# Patient Record
Sex: Female | Born: 1990 | Race: Black or African American | Hispanic: No | Marital: Single | Smoking: Former smoker
Health system: Southern US, Community
[De-identification: ages and names within clinical notes are randomized; demographics above are authoritative.]

## PROBLEM LIST (undated history)

## (undated) DIAGNOSIS — T7840XA Allergy, unspecified, initial encounter: Secondary | ICD-10-CM

## (undated) DIAGNOSIS — J45909 Unspecified asthma, uncomplicated: Secondary | ICD-10-CM

## (undated) HISTORY — DX: Allergy, unspecified, initial encounter: T78.40XA

## (undated) HISTORY — DX: Unspecified asthma, uncomplicated: J45.909

---
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
## 2010-11-23 ENCOUNTER — Emergency Department: Payer: Self-pay | Admitting: Emergency Medicine

## 2011-03-15 ENCOUNTER — Emergency Department: Payer: Self-pay | Admitting: Emergency Medicine

## 2012-01-24 ENCOUNTER — Emergency Department: Payer: Self-pay | Admitting: Emergency Medicine

## 2012-01-24 LAB — COMPREHENSIVE METABOLIC PANEL
Anion Gap: 8 (ref 7–16)
Bilirubin,Total: 0.3 mg/dL (ref 0.2–1.0)
Chloride: 107 mmol/L (ref 98–107)
Co2: 24 mmol/L (ref 21–32)
EGFR (African American): >60
EGFR (Non-African Amer.): >60
Glucose: 79 mg/dL (ref 65–99)
SGOT(AST): 19 U/L (ref 15–37)
Sodium: 139 mmol/L (ref 136–145)
Total Protein: 7.4 g/dL (ref 6.4–8.2)

## 2012-01-24 LAB — CBC
HCT: 34.9 % — ABNORMAL LOW (ref 35.0–47.0)
MCH: 28 pg (ref 26.0–34.0)
MCV: 88 fL (ref 80–100)
Platelet: 279 10*3/uL (ref 150–440)
RBC: 3.96 10*6/uL (ref 3.80–5.20)
RDW: 14.1 % (ref 11.5–14.5)

## 2012-01-29 LAB — CULTURE, BLOOD (SINGLE)

## 2013-10-01 ENCOUNTER — Emergency Department: Payer: Self-pay | Admitting: Emergency Medicine

## 2013-11-10 LAB — HM HIV SCREENING LAB: HM HIV Screening: NEGATIVE

## 2014-09-28 DIAGNOSIS — E663 Overweight: Secondary | ICD-10-CM | POA: Insufficient documentation

## 2014-09-28 DIAGNOSIS — Z8619 Personal history of other infectious and parasitic diseases: Secondary | ICD-10-CM | POA: Insufficient documentation

## 2014-09-28 DIAGNOSIS — F319 Bipolar disorder, unspecified: Secondary | ICD-10-CM | POA: Insufficient documentation

## 2015-11-12 IMAGING — CR DG FOOT COMPLETE 3+V*L*
1 series · 3 of 3 positions shown · non-contrast
Comparison: None.

CLINICAL DATA: Pain and swelling to the left foot after a fall.

EXAM:
LEFT FOOT - COMPLETE 3+ VIEW

[Series 1: x foot ap left · 0.14mm/px · 3 of 3 slices shown]
[im 1/3]
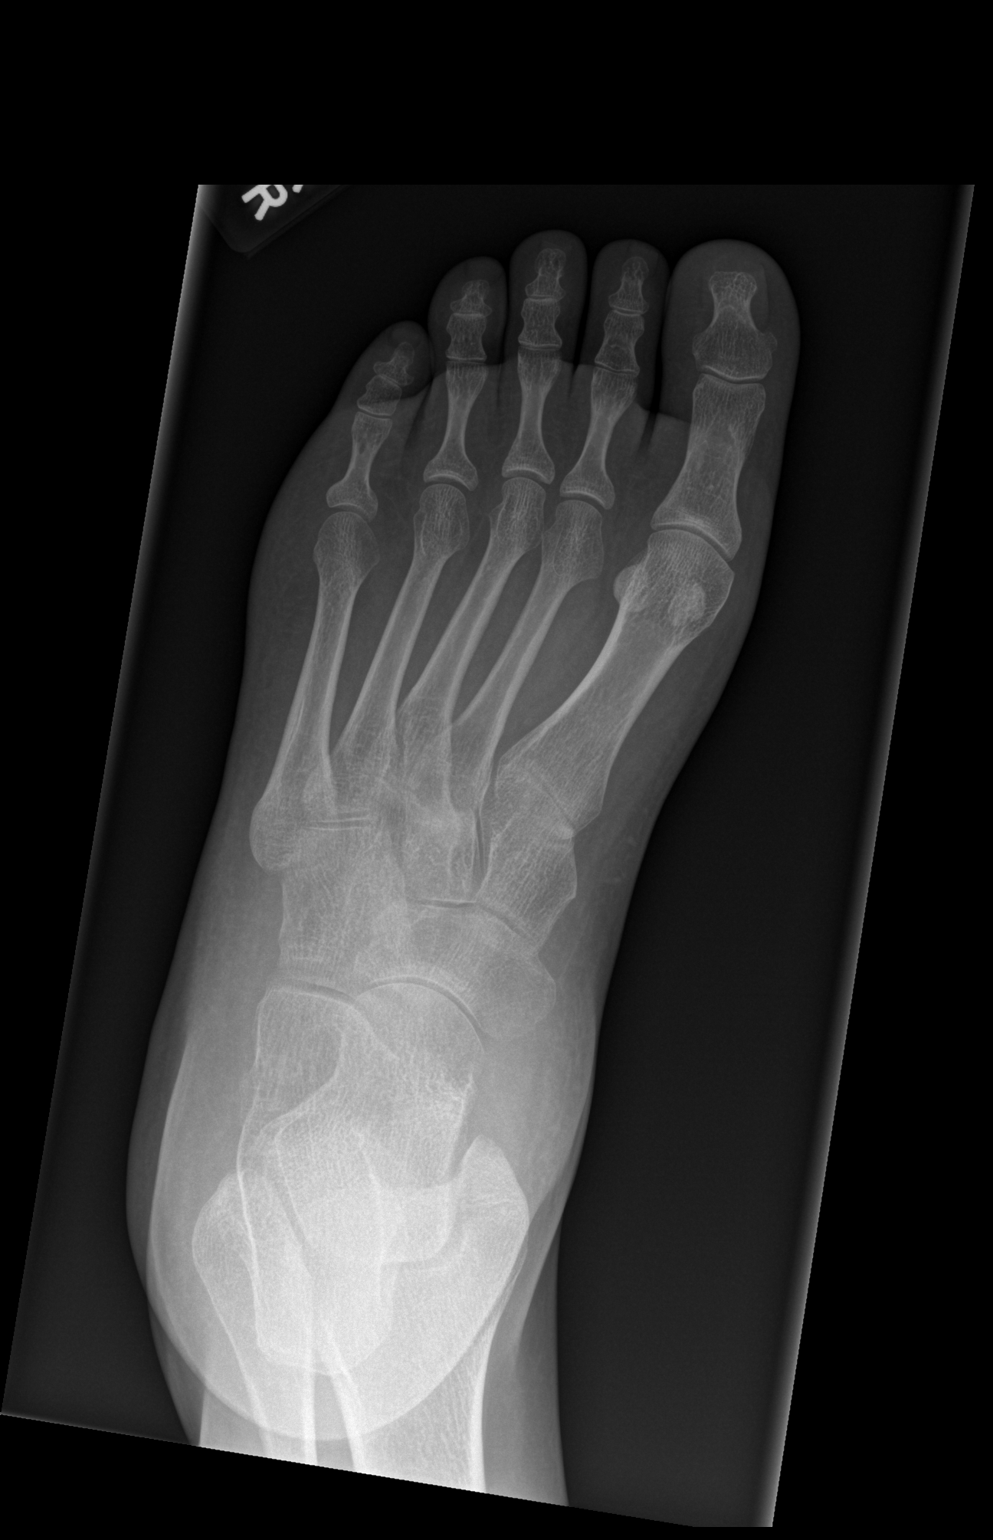
[im 2/3]
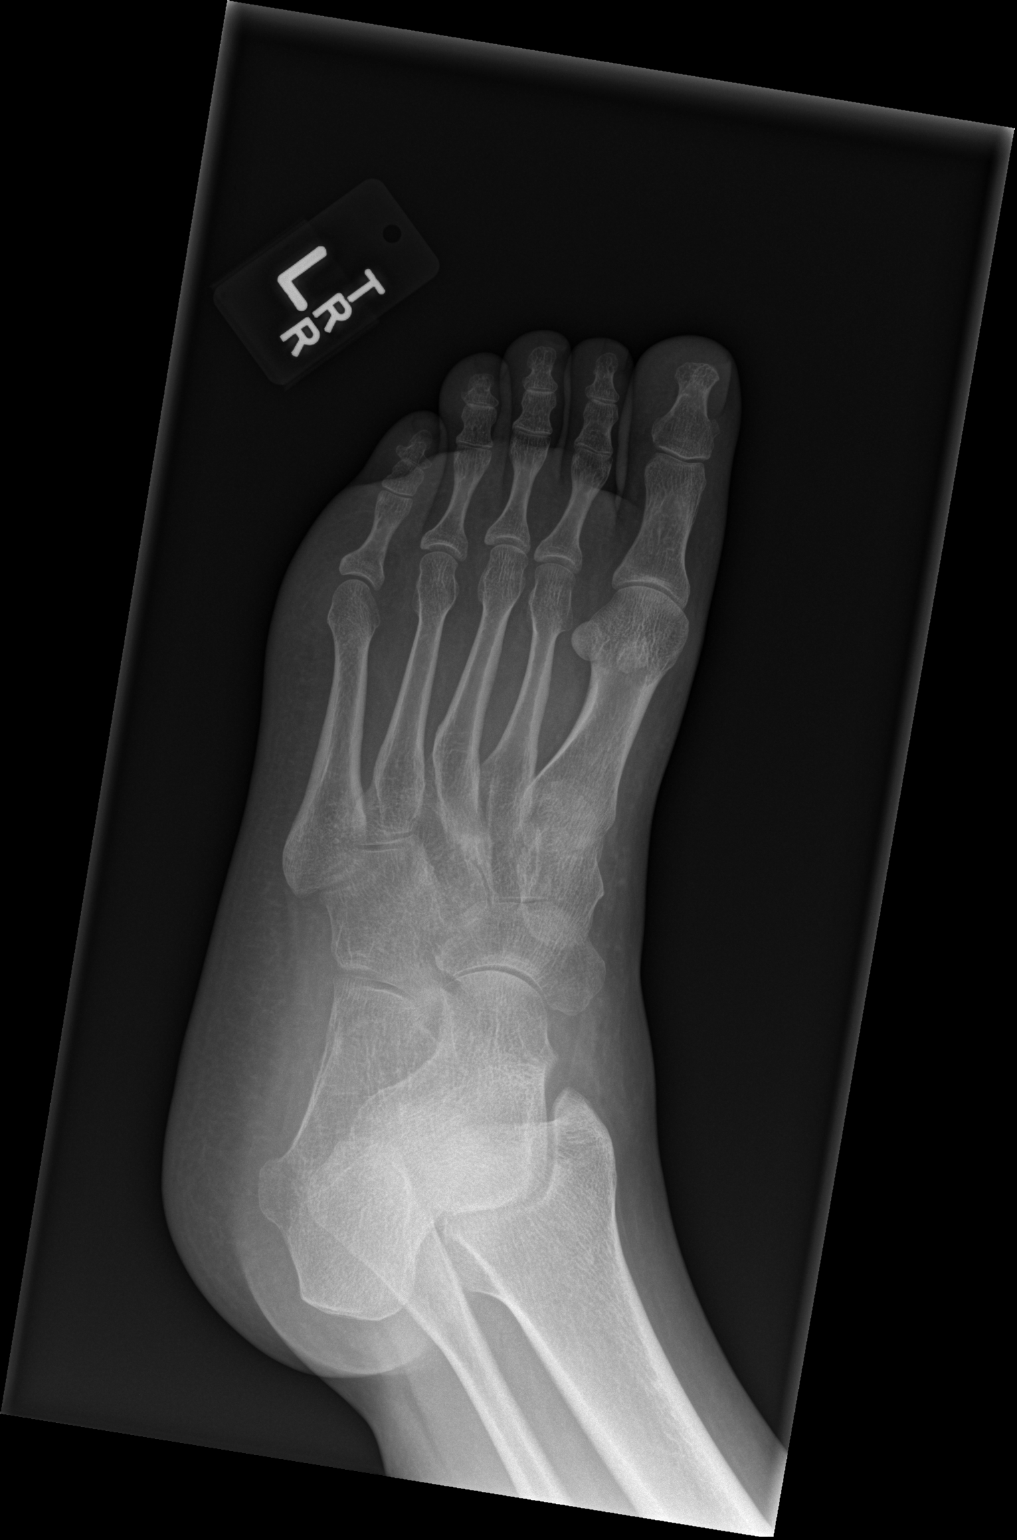
[im 3/3]
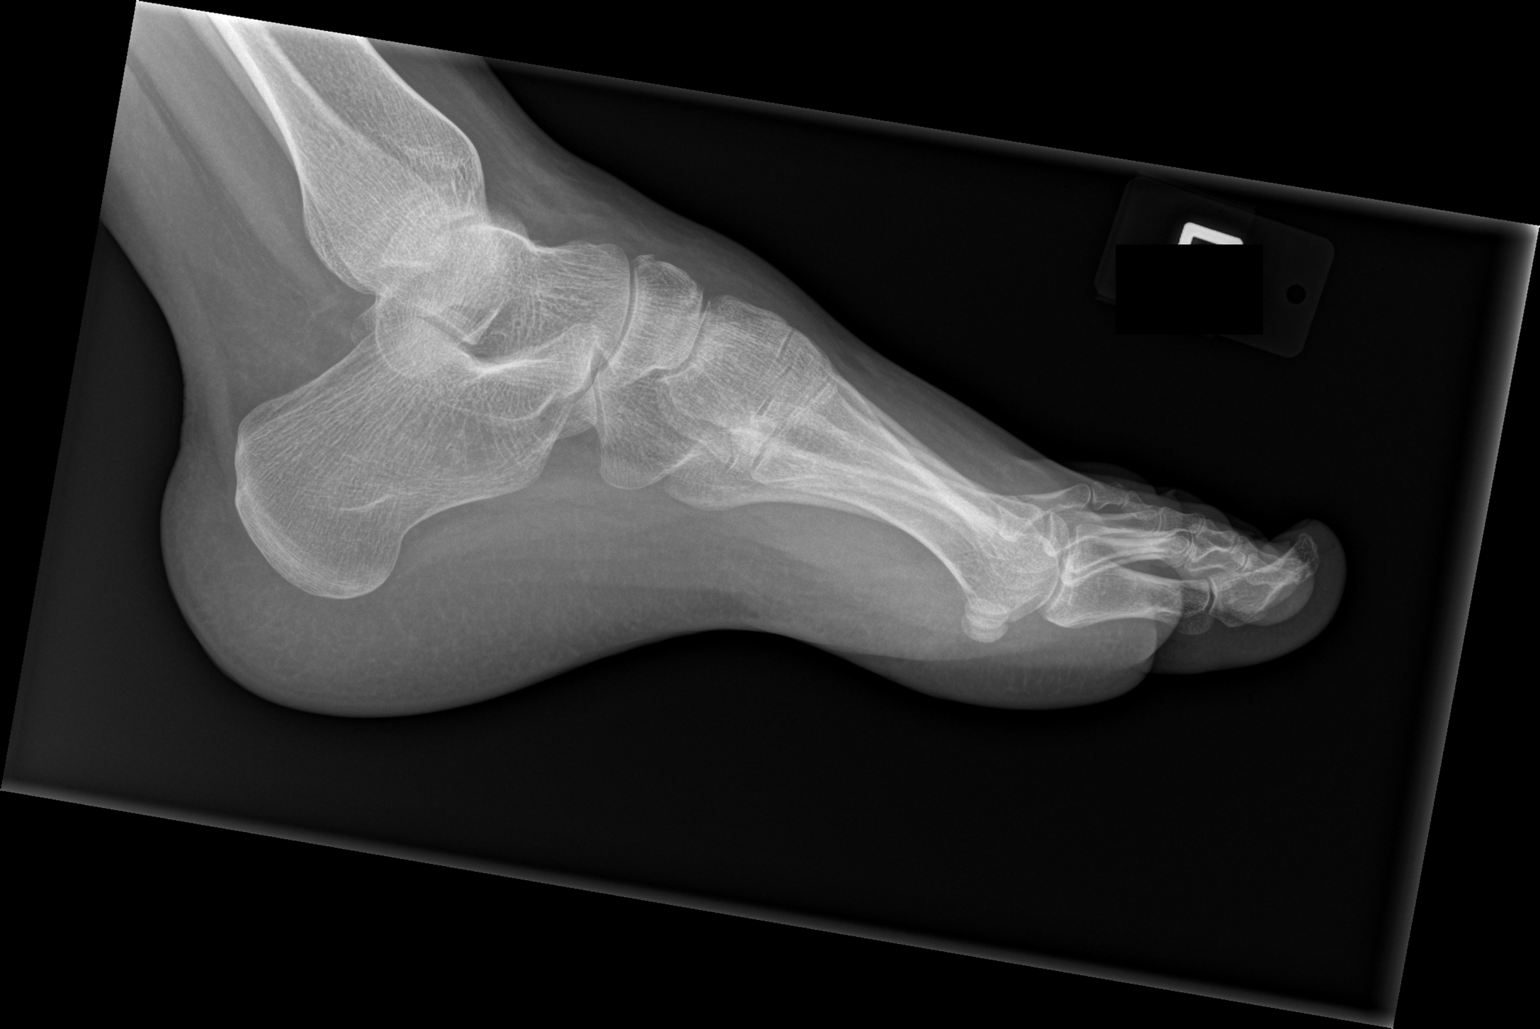

[3 of 3 positions shown; findings below may reference images not displayed]

FINDINGS: Suggestion of small focal cortical defect over the dorsal aspect of
the navicular bone with overlying soft tissue swelling. Suggests a
small avulsion fracture. No displaced fractures identified.
IMPRESSION: Probable small avulsion fracture of the dorsal aspect of the
navicular.

## 2016-02-20 ENCOUNTER — Emergency Department
Admission: EM | Admit: 2016-02-20 | Discharge: 2016-02-20 | Disposition: A | Discharge status: Home / Self Care | Payer: Self-pay | Attending: Student in an Organized Health Care Education/Training Program | Admitting: Student in an Organized Health Care Education/Training Program

## 2016-02-20 ENCOUNTER — Encounter: Payer: Self-pay | Admitting: Emergency Medicine

## 2016-02-20 DIAGNOSIS — L0291 Cutaneous abscess, unspecified: Secondary | ICD-10-CM

## 2016-02-20 DIAGNOSIS — L02211 Cutaneous abscess of abdominal wall: Secondary | ICD-10-CM | POA: Insufficient documentation

## 2016-02-20 MED ORDER — TRAMADOL HCL 50 MG PO TABS: 50.0000 mg | ORAL_TABLET | Freq: Four times a day (QID) | ORAL | 0 refills | Status: AC | PRN

## 2016-02-20 MED ORDER — SULFAMETHOXAZOLE-TRIMETHOPRIM 800-160 MG PO TABS: 1.0000 | ORAL_TABLET | Freq: Two times a day (BID) | ORAL | 0 refills | Status: DC

## 2016-02-20 NOTE — ED Provider Notes (Signed)
[facility] Emergency Department Provider Note   ____________________________________________   None    (approximate)  I have reviewed the triage vital signs and the nursing notes.   HISTORY  Chief Complaint Abscess    HPI Jaclyn Warren is a 25 y.o. female patient complaining of abscess to the lower abdomen for 1 week. Patient state initially the lesion was resolving with the use of Epsom salt compresses. Patient state last 2 days the abscess has increased in size. Patient also states her pain has increased and she rates as a 7/10. Patient described a pain as "sharp". No other palliative measures for this complaint. Patient denies any drainage from the area.   History reviewed. No pertinent past medical history.  There are no active problems to display for this patient.   History reviewed. No pertinent surgical history.  Prior to Admission medications   Medication Sig Start Date End Date Taking? Authorizing Provider  sulfamethoxazole-trimethoprim (BACTRIM DS,SEPTRA DS) 800-160 MG tablet Take 1 tablet by mouth 2 (two) times daily. 02/20/16   Joni Reining, PA-C  traMADol (ULTRAM) 50 MG tablet Take 1 tablet (50 mg total) by mouth every 6 (six) hours as needed. 02/20/16 02/19/17  Joni Reining, PA-C    Allergies Review of patient's allergies indicates no known allergies.  No family history on file.  Social History Social History  Substance Use Topics  . Smoking status: Never Smoker  . Smokeless tobacco: Never Used  . Alcohol use No    Review of Systems Constitutional: No fever/chills Eyes: No visual changes. ENT: No sore throat. Cardiovascular: Denies chest pain. Respiratory: Denies shortness of breath. Gastrointestinal: No abdominal pain.  No nausea, no vomiting.  No diarrhea.  No constipation. Genitourinary: Negative for dysuria. Musculoskeletal: Negative for back pain. Skin: Negative for rash. Nodule lesion lower  abdomen Neurological: Negative for headaches, focal weakness or numbness.   ____________________________________________   PHYSICAL EXAM:  VITAL SIGNS: ED Triage Vitals  Enc Vitals Group     BP 02/20/16 0923 123/86     Pulse Rate 02/20/16 0923 84     Resp 02/20/16 0923 16     Temp 02/20/16 0923 99 F (37.2 C)     Temp Source 02/20/16 0923 Oral     SpO2 02/20/16 0923 99 %     Weight 02/20/16 0924 153 lb (69.4 kg)     Height 02/20/16 0924 5' (1.524 m)     Head Circumference --      Peak Flow --      Pain Score 02/20/16 0925 7     Pain Loc --      Pain Edu? --      Excl. in GC? --     Constitutional: Alert and oriented. Well appearing and in no acute distress. Eyes: Conjunctivae are normal. PERRL. EOMI. Head: Atraumatic. Nose: No congestion/rhinnorhea. Mouth/Throat: Mucous membranes are moist.  Oropharynx non-erythematous. Neck: No stridor.  No cervical spine tenderness to palpation. Hematological/Lymphatic/Immunilogical: No cervical lymphadenopathy. Cardiovascular: Normal rate, regular rhythm. Grossly normal heart sounds.  Good peripheral circulation. Respiratory: Normal respiratory effort.  No retractions. Lungs CTAB. Gastrointestinal: Soft and nontender. No distention. No abdominal bruits. No CVA tenderness. Musculoskeletal: No lower extremity tenderness nor edema.  No joint effusions. Neurologic:  Normal speech and language. No gross focal neurologic deficits are appreciated. No gait instability. Skin:  Skin is warm, dry and intact. No rash noted.Nodular lesion on erythematous base. Area is nonfluctuant at this time. Psychiatric: Mood and affect  are normal. Speech and behavior are normal.  ____________________________________________   LABS (all labs ordered are listed, but only abnormal results are displayed)  Labs Reviewed - No data to  display ____________________________________________  EKG   ____________________________________________  RADIOLOGY   ____________________________________________   PROCEDURES  Procedure(s) performed: None  Procedures  Critical Care performed: No  ____________________________________________   INITIAL IMPRESSION / ASSESSMENT AND PLAN / ED COURSE  Pertinent labs & imaging results that were available during my care of the patient were reviewed by me and considered in my medical decision making (see chart for details).  Abscess lower abdomen. Area is irritated secondary to being located in the waist belt line area. Patient given discharge care instructions. Patient get a prescription for tramadol and Bactrim DS. Patient given a work note. Patient [facility].  Clinical Course     ____________________________________________   FINAL CLINICAL IMPRESSION(S) / ED DIAGNOSES  Final diagnoses:  Abscess      NEW MEDICATIONS STARTED DURING THIS VISIT:  New Prescriptions   SULFAMETHOXAZOLE-TRIMETHOPRIM (BACTRIM DS,SEPTRA DS) 800-160 MG TABLET    Take 1 tablet by mouth 2 (two) times daily.   TRAMADOL (ULTRAM) 50 MG TABLET    Take 1 tablet (50 mg total) by mouth every 6 (six) hours as needed.     Note:  This document was prepared using Dragon voice recognition software and may include unintentional dictation errors.    Joni ReiningRonald K Norissa Bartee, PA-C 02/20/16 1019    Willy EddyPatrick Robinson, MD 02/20/16 1210

## 2016-02-20 NOTE — ED Triage Notes (Signed)
Patient presents to the ED with abscess to left groin.  Patient states, "It just keeps getting bigger and more painful."  Patient denies history of the same.  Patient is in no obvious distress at this time.

## 2016-02-20 NOTE — ED Notes (Signed)
States she developed a possible abscess area to abd couple of days ago  states area is getting larger

## 2017-09-17 ENCOUNTER — Other Ambulatory Visit: Payer: Self-pay

## 2017-09-17 ENCOUNTER — Emergency Department
Admission: EM | Admit: 2017-09-17 | Discharge: 2017-09-17 | Disposition: A | Discharge status: Home / Self Care | Payer: Self-pay | Attending: Student in an Organized Health Care Education/Training Program | Admitting: Student in an Organized Health Care Education/Training Program

## 2017-09-17 DIAGNOSIS — N3001 Acute cystitis with hematuria: Secondary | ICD-10-CM | POA: Insufficient documentation

## 2017-09-17 DIAGNOSIS — R102 Pelvic and perineal pain: Secondary | ICD-10-CM | POA: Insufficient documentation

## 2017-09-17 DIAGNOSIS — Z79899 Other long term (current) drug therapy: Secondary | ICD-10-CM | POA: Insufficient documentation

## 2017-09-17 DIAGNOSIS — R112 Nausea with vomiting, unspecified: Secondary | ICD-10-CM

## 2017-09-17 LAB — COMPREHENSIVE METABOLIC PANEL
ALBUMIN: 4.3 g/dL (ref 3.5–5.0)
ALT: 54 U/L (ref 14–54)
AST: 24 U/L (ref 15–41)
Alkaline Phosphatase: 93 U/L (ref 38–126)
Anion gap: 7 (ref 5–15)
BILIRUBIN TOTAL: 0.4 mg/dL (ref 0.3–1.2)
BUN: 12 mg/dL (ref 6–20)
CO2: 26 mmol/L (ref 22–32)
CREATININE: 0.67 mg/dL (ref 0.44–1.00)
Calcium: 9.3 mg/dL (ref 8.9–10.3)
Chloride: 105 mmol/L (ref 101–111)
GFR calc Af Amer: >60 mL/min (ref >60)
GLUCOSE: 105 mg/dL — AB (ref 65–99)
Potassium: 3.7 mmol/L (ref 3.5–5.1)
Sodium: 138 mmol/L (ref 135–145)
TOTAL PROTEIN: 8.1 g/dL (ref 6.5–8.1)

## 2017-09-17 LAB — URINALYSIS, COMPLETE (UACMP) WITH MICROSCOPIC
Bilirubin Urine: NEGATIVE
Glucose, UA: NEGATIVE mg/dL
HGB URINE DIPSTICK: NEGATIVE
Ketones, ur: NEGATIVE mg/dL
NITRITE: NEGATIVE
PROTEIN: NEGATIVE mg/dL
SPECIFIC GRAVITY, URINE: 1.014 (ref 1.005–1.030)
pH: 8 (ref 5.0–8.0)

## 2017-09-17 LAB — CBC
HEMATOCRIT: 42.4 % (ref 35.0–47.0)
Hemoglobin: 13.7 g/dL (ref 12.0–16.0)
MCH: 28.1 pg (ref 26.0–34.0)
MCHC: 32.3 g/dL (ref 32.0–36.0)
MCV: 86.9 fL (ref 80.0–100.0)
PLATELETS: 289 10*3/uL (ref 150–440)
RBC: 4.88 MIL/uL (ref 3.80–5.20)
RDW: 15.5 % — ABNORMAL HIGH (ref 11.5–14.5)
WBC: 12.1 10*3/uL — AB (ref 3.6–11.0)

## 2017-09-17 LAB — HCG, QUANTITATIVE, PREGNANCY

## 2017-09-17 LAB — LIPASE, BLOOD: Lipase: 24 U/L (ref 11–51)

## 2017-09-17 LAB — POCT PREGNANCY, URINE: PREG TEST UR: NEGATIVE

## 2017-09-17 MED ORDER — PROCHLORPERAZINE MALEATE 10 MG PO TABS: 10.0000 mg | ORAL_TABLET | Freq: Four times a day (QID) | ORAL | 0 refills | Status: DC | PRN

## 2017-09-17 MED ORDER — NITROFURANTOIN MONOHYD MACRO 100 MG PO CAPS: 100.0000 mg | ORAL_CAPSULE | Freq: Two times a day (BID) | ORAL | 0 refills | Status: DC

## 2017-09-17 MED ORDER — NITROFURANTOIN MONOHYD MACRO 100 MG PO CAPS: 100.0000 mg | ORAL_CAPSULE | Freq: Two times a day (BID) | ORAL | 0 refills | Status: AC

## 2017-09-17 NOTE — Discharge Instructions (Signed)

## 2017-09-17 NOTE — ED Notes (Signed)
First Nurse Note:  Patient reports lower abdominal pain and discomfort.  Patient states she thinks she may be pregnant.  States she takes Depo but missed her last shot and hasn't had a period since.  Patient states, "I can't take a urine pregnancy test, they are not accurate for me."

## 2017-09-17 NOTE — ED Provider Notes (Signed)
[facility] Emergency Department Provider Note    First MD Initiated Contact with Patient 09/17/17 1920     (approximate)  I have reviewed the triage vital signs and the nursing notes.   HISTORY  Chief Complaint Abdominal Pain    HPI Jaclyn Warren is a 27 y.o. female presents with fluttering feeling in her abdomen with some dysuria increased frequency and concern that she is pregnant.  Was having associated nausea and vomiting but denies any today her right now..  Denies any significant pain at this time no lateralizing pain.  No flank pain.  No fevers.  No chest pain or shortness of breath.  Did take regnancy test at home which were repeatedly negative.  History reviewed. No pertinent past medical history. No family history on file. History reviewed. No pertinent surgical history. There are no active problems to display for this patient.     Prior to Admission medications   Medication Sig Start Date End Date Taking? Authorizing Provider  nitrofurantoin, macrocrystal-monohydrate, (MACROBID) 100 MG capsule Take 1 capsule (100 mg total) by mouth 2 (two) times daily for 5 days. 09/17/17 09/22/17  Willy Eddy, MD  prochlorperazine (COMPAZINE) 10 MG tablet Take 1 tablet (10 mg total) by mouth every 6 (six) hours as needed for nausea or vomiting. 09/17/17   Willy Eddy, MD  sulfamethoxazole-trimethoprim (BACTRIM DS,SEPTRA DS) 800-160 MG tablet Take 1 tablet by mouth 2 (two) times daily. 02/20/16   Joni Reining, PA-C    Allergies Patient has no known allergies.    Social History Social History   Tobacco Use  . Smoking status: Never Smoker  . Smokeless tobacco: Never Used  Substance Use Topics  . Alcohol use: No  . Drug use: Not on file    Review of Systems Patient denies headaches, rhinorrhea, blurry vision, numbness, shortness of breath, chest pain, edema, cough, abdominal pain, nausea, vomiting, diarrhea, dysuria, fevers, rashes or  hallucinations unless otherwise stated above in HPI. ____________________________________________   PHYSICAL EXAM:  VITAL SIGNS: Vitals:   09/17/17 1748 09/17/17 2013  BP: 135/89 130/82  Pulse: 86 80  Resp: 17 16  Temp: 98.9 F (37.2 C)   SpO2: 97% 98%    Constitutional: Alert and oriented. Well appearing and in no acute distress. Eyes: Conjunctivae are normal.  Head: Atraumatic. Nose: No congestion/rhinnorhea. Mouth/Throat: Mucous membranes are moist.   Neck: Painless ROM.  Cardiovascular:   Good peripheral circulation. Respiratory: Normal respiratory effort.  No retractions.  Gastrointestinal: Soft and nontender.  Musculoskeletal: No lower extremity tenderness .  No joint effusions. Neurologic:  Normal speech and language. No gross focal neurologic deficits are appreciated.  Skin:  Skin is warm, dry and intact. No rash noted. Psychiatric: Mood and affect are normal. Speech and behavior are normal.  ____________________________________________   LABS (all labs ordered are listed, but only abnormal results are displayed)  Results for orders placed or performed during the hospital encounter of 09/17/17 (from the past 24 hour(s))  Lipase, blood     Status: None   Collection Time: 09/17/17  5:49 PM  Result Value Ref Range   Lipase 24 11 - 51 U/L  Comprehensive metabolic panel     Status: Abnormal   Collection Time: 09/17/17  5:49 PM  Result Value Ref Range   Sodium 138 135 - 145 mmol/L   Potassium 3.7 3.5 - 5.1 mmol/L   Chloride 105 101 - 111 mmol/L   CO2 26 22 - 32 mmol/L  Glucose, Bld 105 (H) 65 - 99 mg/dL   BUN 12 6 - 20 mg/dL   Creatinine, Ser 8.110.67 0.44 - 1.00 mg/dL   Calcium 9.3 8.9 - 91.410.3 mg/dL   Total Protein 8.1 6.5 - 8.1 g/dL   Albumin 4.3 3.5 - 5.0 g/dL   AST 24 15 - 41 U/L   ALT 54 14 - 54 U/L   Alkaline Phosphatase 93 38 - 126 U/L   Total Bilirubin 0.4 0.3 - 1.2 mg/dL   GFR calc non Af Amer >60 >60 mL/min   GFR calc Af Amer >60 >60 mL/min   Anion  gap 7 5 - 15  CBC     Status: Abnormal   Collection Time: 09/17/17  5:49 PM  Result Value Ref Range   WBC 12.1 (H) 3.6 - 11.0 K/uL   RBC 4.88 3.80 - 5.20 MIL/uL   Hemoglobin 13.7 12.0 - 16.0 g/dL   HCT 78.242.4 95.635.0 - 21.347.0 %   MCV 86.9 80.0 - 100.0 fL   MCH 28.1 26.0 - 34.0 pg   MCHC 32.3 32.0 - 36.0 g/dL   RDW 08.615.5 (H) 57.811.5 - 46.914.5 %   Platelets 289 150 - 440 K/uL  Urinalysis, Complete w Microscopic     Status: Abnormal   Collection Time: 09/17/17  5:49 PM  Result Value Ref Range   Color, Urine YELLOW (A) YELLOW   APPearance CLOUDY (A) CLEAR   Specific Gravity, Urine 1.014 1.005 - 1.030   pH 8.0 5.0 - 8.0   Glucose, UA NEGATIVE NEGATIVE mg/dL   Hgb urine dipstick NEGATIVE NEGATIVE   Bilirubin Urine NEGATIVE NEGATIVE   Ketones, ur NEGATIVE NEGATIVE mg/dL   Protein, ur NEGATIVE NEGATIVE mg/dL   Nitrite NEGATIVE NEGATIVE   Leukocytes, UA TRACE (A) NEGATIVE   RBC / HPF 0-5 0 - 5 RBC/hpf   WBC, UA 0-5 0 - 5 WBC/hpf   Bacteria, UA RARE (A) NONE SEEN   Squamous Epithelial / LPF 0-5 (A) NONE SEEN   Mucus PRESENT    Amorphous Crystal PRESENT   hCG, quantitative, pregnancy     Status: None   Collection Time: 09/17/17  5:50 PM  Result Value Ref Range   hCG, Beta Chain, Quant, S <1 <5 mIU/mL  Pregnancy, urine POC     Status: None   Collection Time: 09/17/17  5:54 PM  Result Value Ref Range   Preg Test, Ur NEGATIVE NEGATIVE   ____________________________________________ ____________________________________________   PROCEDURES  Procedure(s) performed:  Procedures    Critical Care performed: no ____________________________________________   INITIAL IMPRESSION / ASSESSMENT AND PLAN / ED COURSE  Pertinent labs & imaging results that were available during my care of the patient were reviewed by me and considered in my medical decision making (see chart for details).  DDX: Pregnancy, UTI, PID, enteritis, constipation  Jaclyn Warren is a 27 y.o. who presents to the ED  with symptoms as described above.  Her abdominal exam is soft and benign.  Patient is not pregnant.  Does have evidence of UTI probable cystitis causing some discomfort.  Is not clinically consistent with acute appendicitis, torsion, acute abdomen.  Patient was able to tolerate PO and was able to ambulate with a steady gait.  Have discussed with the patient and available family all diagnostics and treatments performed thus far and all questions were answered to the best of my ability. The patient demonstrates understanding and agreement with plan.       ____________________________________________   FINAL  CLINICAL IMPRESSION(S) / ED DIAGNOSES  Final diagnoses:  Non-intractable vomiting with nausea, unspecified vomiting type  Acute cystitis with hematuria      NEW MEDICATIONS STARTED DURING THIS VISIT:  Discharge Medication List as of 09/17/2017  7:51 PM       Note:  This document was prepared using Dragon voice recognition software and may include unintentional dictation errors.     Willy Eddy, MD 09/17/17 2306

## 2017-09-17 NOTE — ED Triage Notes (Signed)
Pt states I think Im pregnant but all the urine test are negative. States she feels fluttering in her abd. States she does not have a period due to being on depo.

## 2017-11-18 LAB — HM PAP SMEAR: HM Pap smear: NEGATIVE

## 2018-08-08 ENCOUNTER — Other Ambulatory Visit: Payer: Self-pay

## 2018-08-08 ENCOUNTER — Encounter: Payer: Self-pay | Admitting: Family Medicine

## 2018-08-08 ENCOUNTER — Ambulatory Visit: Payer: Self-pay | Admitting: Family Medicine

## 2018-08-08 VITALS — BP 118/76 | HR 83 | Temp 98.5°F | Resp 16 | Ht 59.0 in | Wt 151.3 lb

## 2018-08-08 DIAGNOSIS — R0789 Other chest pain: Secondary | ICD-10-CM

## 2018-08-08 DIAGNOSIS — J4520 Mild intermittent asthma, uncomplicated: Secondary | ICD-10-CM

## 2018-08-08 DIAGNOSIS — M25512 Pain in left shoulder: Secondary | ICD-10-CM

## 2018-08-08 DIAGNOSIS — Z13220 Encounter for screening for lipoid disorders: Secondary | ICD-10-CM

## 2018-08-08 DIAGNOSIS — N898 Other specified noninflammatory disorders of vagina: Secondary | ICD-10-CM

## 2018-08-08 MED ORDER — TIZANIDINE HCL 4 MG PO TABS: 2.0000 mg | ORAL_TABLET | Freq: Three times a day (TID) | ORAL | 0 refills | Status: DC | PRN

## 2018-08-08 MED ORDER — FLUCONAZOLE 150 MG PO TABS: ORAL_TABLET | ORAL | 0 refills | Status: DC

## 2018-08-08 NOTE — Assessment & Plan Note (Signed)
Pain is very much reproducible, we'll try muscle relaxer, and if not improving over the weekend, will obtain labs on Monday (3 days).  Red flags for ER presentation over the weekend are discussed in detail.  May take OTC Aleve or ibuprofen along with Tizanidine.

## 2018-08-08 NOTE — Progress Notes (Signed)
New Patient Office Visit  Subjective:  Patient ID: Jaclyn Warren, female    DOB: 11/08/1990  Age: 28 y.o. MRN: [id number]  CC:  Chief Complaint  Patient presents with  . Establish Care  . Muscle Pain    possible pulled muscle, feel pain more when moving left arm    HPI Sheetal B Lien presents for establish care and for the following concern:  LEFT shoulder pain that radiates into the LEFT breast/chest for about 5 days. The pain is constant, 6/10, and sore; it is reproducible with certain motions and with lifting.  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 Does have history of asthma - uses albuterol PRN, has some nighttime coughing.  Her mother did pass away from MI at age 74, and remainder of family history is unknown.  She denies any other body aches or muscle aches, she has not had any lipid panel checked recently.  Endorses increase gassiness over the last few weeks. No heartburn or regurgitation.   - She had 2 teeth pulled recently - she has been switching between tylenol and ibuprofen for this. No recent antibiotic use. - Vaginal Itching: Vaginal itching and thick white discharge for about 2 weeks. Doing Monistat, today is her 7th day. No abdominal pain. No new sexual partners in the last years, monogamous for 12 years.   - Depo/Contraception: She is behind on her Depo shots as well. [facility]. Has not resumed menses, does have history of anemia.   Past Medical History:  Diagnosis Date  . Allergy   . Asthma     Past Surgical History:  Procedure Laterality Date  . CESAREAN SECTION      Family History  Problem Relation Age of Onset  . Heart disease Mother   . Heart attack Mother 12  . Stomach cancer Mother        Micah Flesher into remission    Social History   Socioeconomic History  . Marital status: Single    Spouse name:  Not on file  . Number of children: 2  . Years of education: Not on file  . Highest education level: Not on file  Occupational History  . Not on file  Social Needs  . Financial resource strain: Not hard at all  . Food insecurity:    Worry: Never true    Inability: Never true  . Transportation needs:    Medical: No    Non-medical: No  Tobacco Use  . Smoking status: Never Smoker  . Smokeless tobacco: Never Used  Substance and Sexual Activity  . Alcohol use: No  . Drug use: Never  . Sexual activity: Yes    Partners: Male    Birth control/protection: Injection  Lifestyle  . Physical activity:    Days per week: 1 day    Minutes per session: 120 min  . Stress: Not at all  Relationships  . Social connections:    Talks on phone: More than three times a week    Gets together: More than three times a week    Attends religious service: More than 4 times per year    Active member of club or organization: Yes    Attends meetings of clubs or organizations: Never    Relationship status: Married  . Intimate partner violence:    Fear of current or ex  partner: No    Emotionally abused: No    Physically abused: No    Forced sexual activity: No  Other Topics Concern  . Not on file  Social History Narrative  . Not on file    ROS Review of Systems  Ten systems reviewed and is negative except as mentioned in HPI.  Objective:   Today's Vitals: BP 118/76 (BP Location: Right Arm, Patient Position: Sitting, Cuff Size: Large)   Pulse 83   Temp 98.5 F (36.9 C) (Oral)   Resp 16   Ht 4\' 11"  (1.499 m)   Wt 151 lb 4.8 oz (68.6 kg)   SpO2 93%   BMI 30.56 kg/m   Physical Exam Constitutional: Patient appears well-developed and well-nourished. No distress.  HENT: Head: Normocephalic and atraumatic. Neck: Normal range of motion. Neck supple. No JVD present. No thyromegaly present.  Cardiovascular: Normal rate, regular rhythm and normal heart sounds.  No murmur heard. No BLE  edema. Pulmonary/Chest: Effort normal and breath sounds normal. No respiratory distress.  Abdominal: Soft. Bowel sounds are normal, no distension. There is no tenderness. No masses. Breast Examination: No mass/lump palpated, no swelling, no discoloration, no nipple abnormality or discharge. Musculoskeletal: Normal range of motion, no joint effusions. No gross deformities.  LEFT upper chest, and left trapezius both exhibit reproducible pain.  Neurological: Pt is alert and oriented to person, place, and time. No cranial nerve deficit. Coordination, balance, strength, speech and gait are normal.  Skin: Skin is warm and dry. No rash noted. No erythema.  Psychiatric: Patient has a normal mood and affect. behavior is normal. Judgment and thought content normal.  Assessment & Plan:   Problem List Items Addressed This Visit    None    Visit Diagnoses    Mild intermittent asthma without complication    -  Primary   Acute pain of left shoulder       Relevant Medications   tiZANidine (ZANAFLEX) 4 MG tablet   Other Relevant Orders   COMPLETE METABOLIC PANEL WITH GFR   EKG 69-[facility]   Lipid screening       Relevant Orders   Lipid panel   Intermittent left-sided chest pain       Relevant Medications   tiZANidine (ZANAFLEX) 4 MG tablet   Other Relevant Orders   Lipid panel   COMPLETE METABOLIC PANEL WITH GFR   EKG 38-[facility]   Troponin I   CBC     Outpatient Encounter Medications as of 08/08/2018  Medication Sig  . tiZANidine (ZANAFLEX) 4 MG tablet Take 0.5-1 tablets (2-4 mg total) by mouth every 8 (eight) hours as needed for muscle spasms.  . [DISCONTINUED] prochlorperazine (COMPAZINE) 10 MG tablet Take 1 tablet (10 mg total) by mouth every 6 (six) hours as needed for nausea or vomiting. (Patient not taking: Reported on 08/08/2018)  . [DISCONTINUED] sulfamethoxazole-trimethoprim (BACTRIM DS,SEPTRA DS) 800-160 MG tablet Take 1 tablet by mouth 2 (two) times daily. (Patient not taking: Reported on  08/08/2018)   No facility-administered encounter medications on file as of 08/08/2018.    Follow-up: Return for 2-3 week follow up on LEFT shoulder pain.Doren Custard, FNP

## 2018-08-08 NOTE — Addendum Note (Signed)
Addended by: Doren Custard on: 08/08/2018 01:34 PM   Modules accepted: Orders

## 2018-08-08 NOTE — Patient Instructions (Addendum)
Please try the Muscle Relaxer this weekend, apply heat, and do gentle stretching as below.   If your chest pain worsens, or you develop shortness of breath, please go to the ER.  Shoulder Exercises Ask your health care provider which exercises are safe for you. Do exercises exactly as told by your health care provider and adjust them as directed. It is normal to feel mild stretching, pulling, tightness, or discomfort as you do these exercises, but you should stop right away if you feel sudden pain or your pain gets worse.Do not begin these exercises until told by your health care provider. Range of Motion Exercises        These exercises warm up your muscles and joints and improve the movement and flexibility of your shoulder. These exercises also help to relieve pain, numbness, and tingling. These exercises involve stretching your injured shoulder directly. Exercise A: Pendulum 1. Stand near a wall or a surface that you can hold onto for balance. 2. Bend at the waist and let your left / right arm hang straight down. Use your other arm to support you. Keep your back straight and do not lock your knees. 3. Relax your left / right arm and shoulder muscles, and move your hips and your trunk so your left / right arm swings freely. Your arm should swing because of the motion of your body, not because you are using your arm or shoulder muscles. 4. Keep moving your body so your arm swings in the following directions, as told by your health care provider: ? Side to side. ? Forward and backward. ? In clockwise and counterclockwise circles. 5. Continue each motion for __________ seconds, or for as long as told by your health care provider. 6. Slowly return to the starting position. Repeat __________ times. Complete this exercise __________ times a day. Exercise B:Flexion, Standing 1. Stand and hold a broomstick, a cane, or a similar object. Place your hands a little more than shoulder-width apart on  the object. Your left / right hand should be palm-up, and your other hand should be palm-down. 2. Keep your elbow straight and keep your shoulder muscles relaxed. Push the stick down with your healthy arm to raise your left / right arm in front of your body, and then over your head until you feel a stretch in your shoulder. ? Avoid shrugging your shoulder while you raise your arm. Keep your shoulder blade tucked down toward the middle of your back. 3. Hold for __________ seconds. 4. Slowly return to the starting position. Repeat __________ times. Complete this exercise __________ times a day. Exercise C: Abduction, Standing 1. Stand and hold a broomstick, a cane, or a similar object. Place your hands a little more than shoulder-width apart on the object. Your left / right hand should be palm-up, and your other hand should be palm-down. 2. While keeping your elbow straight and your shoulder muscles relaxed, push the stick across your body toward your left / right side. Raise your left / right arm to the side of your body and then over your head until you feel a stretch in your shoulder. ? Do not raise your arm above shoulder height, unless your health care provider tells you to do that. ? Avoid shrugging your shoulder while you raise your arm. Keep your shoulder blade tucked down toward the middle of your back. 3. Hold for __________ seconds. 4. Slowly return to the starting position. Repeat __________ times. Complete this exercise __________ times a  day. Exercise D:Internal Rotation 1. Place your left / right hand behind your back, palm-up. 2. Use your other hand to dangle an exercise band, a towel, or a similar object over your shoulder. Grasp the band with your left / right hand so you are holding onto both ends. 3. Gently pull up on the band until you feel a stretch in the front of your left / right shoulder. ? Avoid shrugging your shoulder while you raise your arm. Keep your shoulder blade  tucked down toward the middle of your back. 4. Hold for __________ seconds. 5. Release the stretch by letting go of the band and lowering your hands. Repeat __________ times. Complete this exercise __________ times a day. Stretching Exercises  These exercises warm up your muscles and joints and improve the movement and flexibility of your shoulder. These exercises also help to relieve pain, numbness, and tingling. These exercises are done using your healthy shoulder to help stretch the muscles of your injured shoulder. Exercise E: Research officer, political party (External Rotation and Abduction) 1. Stand in a doorway with one of your feet slightly in front of the other. This is called a staggered stance. If you cannot reach your forearms to the door frame, stand facing a corner of a room. 2. Choose one of the following positions as told by your health care provider: ? Place your hands and forearms on the door frame above your head. ? Place your hands and forearms on the door frame at the height of your head. ? Place your hands on the door frame at the height of your elbows. 3. Slowly move your weight onto your front foot until you feel a stretch across your chest and in the front of your shoulders. Keep your head and chest upright and keep your abdominal muscles tight. 4. Hold for __________ seconds. 5. To release the stretch, shift your weight to your back foot. Repeat __________ times. Complete this stretch __________ times a day. Exercise F:Extension, Standing 1. Stand and hold a broomstick, a cane, or a similar object behind your back. ? Your hands should be a little wider than shoulder-width apart. ? Your palms should face away from your back. 2. Keeping your elbows straight and keeping your shoulder muscles relaxed, move the stick away from your body until you feel a stretch in your shoulder. ? Avoid shrugging your shoulders while you move the stick. Keep your shoulder blade tucked down toward the middle  of your back. 3. Hold for __________ seconds. 4. Slowly return to the starting position. Repeat __________ times. Complete this exercise __________ times a day. Strengthening Exercises           These exercises build strength and endurance in your shoulder. Endurance is the ability to use your muscles for a long time, even after they get tired. Exercise G:External Rotation 1. Sit in a stable chair without armrests. 2. Secure an exercise band at elbow height on your left / right side. 3. Place a soft object, such as a folded towel or a small pillow, between your left / right upper arm and your body to move your elbow a few inches away (about 10 cm) from your side. 4. Hold the end of the band so it is tight and there is no slack. 5. Keeping your elbow pressed against the soft object, move your left / right forearm out, away from your abdomen. Keep your body steady so only your forearm moves. 6. Hold for __________ seconds. 7. Slowly return  to the starting position. Repeat __________ times. Complete this exercise __________ times a day. Exercise H:Shoulder Abduction 1. Sit in a stable chair without armrests, or stand. 2. Hold a __________ weight in your left / right hand, or hold an exercise band with both hands. 3. Start with your arms straight down and your left / right palm facing in, toward your body. 4. Slowly lift your left / right hand out to your side. Do not lift your hand above shoulder height unless your health care provider tells you that this is safe. ? Keep your arms straight. ? Avoid shrugging your shoulder while you do this movement. Keep your shoulder blade tucked down toward the middle of your back. 5. Hold for __________ seconds. 6. Slowly lower your arm, and return to the starting position. Repeat __________ times. Complete this exercise __________ times a day. Exercise I:Shoulder Extension 1. Sit in a stable chair without armrests, or stand. 2. Secure an  exercise band to a stable object in front of you where it is at shoulder height. 3. Hold one end of the exercise band in each hand. Your palms should face each other. 4. Straighten your elbows and lift your hands up to shoulder height. 5. Step back, away from the secured end of the exercise band, until the band is tight and there is no slack. 6. Squeeze your shoulder blades together as you pull your hands down to the sides of your thighs. Stop when your hands are straight down by your sides. Do not let your hands go behind your body. 7. Hold for __________ seconds. 8. Slowly return to the starting position. Repeat __________ times. Complete this exercise __________ times a day. Exercise J:Standing Shoulder Row 1. Sit in a stable chair without armrests, or stand. 2. Secure an exercise band to a stable object in front of you so it is at waist height. 3. Hold one end of the exercise band in each hand. Your palms should be in a thumbs-up position. 4. Bend each of your elbows to an "L" shape (about 90 degrees) and keep your upper arms at your sides. 5. Step back until the band is tight and there is no slack. 6. Slowly pull your elbows back behind you. 7. Hold for __________ seconds. 8. Slowly return to the starting position. Repeat __________ times. Complete this exercise __________ times a day. Exercise K:Shoulder Press-Ups 1. Sit in a stable chair that has armrests. Sit upright, with your feet flat on the floor. 2. Put your hands on the armrests so your elbows are bent and your fingers are pointing forward. Your hands should be about even with the sides of your body. 3. Push down on the armrests and use your arms to lift yourself off of the chair. Straighten your elbows and lift yourself up as much as you comfortably can. ? Move your shoulder blades down, and avoid letting your shoulders move up toward your ears. ? Keep your feet on the ground. As you get stronger, your feet should support less  of your body weight as you lift yourself up. 4. Hold for __________ seconds. 5. Slowly lower yourself back into the chair. Repeat __________ times. Complete this exercise __________ times a day. Exercise L: Wall Push-Ups 1. Stand so you are facing a stable wall. Your feet should be about one arm-length away from the wall. 2. Lean forward and place your palms on the wall at shoulder height. 3. Keep your feet flat on the floor as you  bend your elbows and lean forward toward the wall. 4. Hold for __________ seconds. 5. Straighten your elbows to push yourself back to the starting position. Repeat __________ times. Complete this exercise __________ times a day. This information is not intended to replace advice given to you by your health care provider. Make sure you discuss any questions you have with your health care provider. Document Released: 04/11/2005 Document Revised: 10/01/2017 Document Reviewed: 02/06/2015 Elsevier Interactive Patient Education  2019 [employer]

## 2018-08-12 ENCOUNTER — Telehealth: Payer: Self-pay | Admitting: Family Medicine

## 2018-08-12 DIAGNOSIS — N898 Other specified noninflammatory disorders of vagina: Secondary | ICD-10-CM

## 2018-08-12 MED ORDER — FLUCONAZOLE 150 MG PO TABS: ORAL_TABLET | ORAL | 0 refills | Status: DC

## 2018-08-12 NOTE — Telephone Encounter (Signed)
Pt called in - vaginal itching has improved but is not completely cleared. We will trial 1 additional diflucan, and if not better in 3 days, will come in for CMA visit for swab.

## 2018-08-25 ENCOUNTER — Ambulatory Visit: Payer: Self-pay | Admitting: Family Medicine

## 2018-12-18 ENCOUNTER — Ambulatory Visit: Payer: Self-pay

## 2019-01-22 DIAGNOSIS — Z8619 Personal history of other infectious and parasitic diseases: Secondary | ICD-10-CM

## 2019-01-22 DIAGNOSIS — E663 Overweight: Secondary | ICD-10-CM

## 2019-01-23 ENCOUNTER — Other Ambulatory Visit: Payer: Self-pay

## 2019-01-23 ENCOUNTER — Ambulatory Visit (LOCAL_COMMUNITY_HEALTH_CENTER): Payer: 59

## 2019-01-23 VITALS — BP 108/81 | Wt 147.0 lb

## 2019-01-23 DIAGNOSIS — Z3009 Encounter for other general counseling and advice on contraception: Secondary | ICD-10-CM

## 2019-01-23 DIAGNOSIS — Z30013 Encounter for initial prescription of injectable contraceptive: Secondary | ICD-10-CM

## 2019-01-23 MED ORDER — MEDROXYPROGESTERONE ACETATE 150 MG/ML IM SUSP
150.0000 mg | Freq: Once | INTRAMUSCULAR | Status: AC
  Administered 2019-01-23: 17:00:00 150 mg via INTRAMUSCULAR

## 2019-01-23 NOTE — Progress Notes (Signed)
Depo given per K. Newton SO (one Depo after RP expires). Aileen Fass, RN

## 2019-04-01 ENCOUNTER — Other Ambulatory Visit: Payer: Self-pay

## 2019-04-01 DIAGNOSIS — Z20822 Contact with and (suspected) exposure to covid-19: Secondary | ICD-10-CM

## 2019-04-02 LAB — NOVEL CORONAVIRUS, NAA: SARS-CoV-2, NAA: NOT DETECTED

## 2019-04-14 ENCOUNTER — Other Ambulatory Visit: Payer: Self-pay

## 2019-04-14 ENCOUNTER — Ambulatory Visit (LOCAL_COMMUNITY_HEALTH_CENTER): Payer: 59

## 2019-04-14 VITALS — BP 123/87 | Ht 60.0 in | Wt 153.5 lb

## 2019-04-14 DIAGNOSIS — Z3009 Encounter for other general counseling and advice on contraception: Secondary | ICD-10-CM

## 2019-04-14 DIAGNOSIS — Z30013 Encounter for initial prescription of injectable contraceptive: Secondary | ICD-10-CM

## 2019-04-14 MED ORDER — MEDROXYPROGESTERONE ACETATE 150 MG/ML IM SUSP
150.0000 mg | Freq: Once | INTRAMUSCULAR | Status: AC
  Administered 2019-04-14: 11:00:00 150 mg via INTRAMUSCULAR

## 2019-04-14 NOTE — Progress Notes (Signed)
Last physical at [facility] was 11/18/2017. Last depo at [facility] was 01/23/2019; 11.4 weeks post depo today. Per [facility] records: 11/18/17 PAP was NILM (Cotest PAP due 2022, CCBE due 2022). Consulted with provider regarding pt request to continue with depo today. Per verbal order by Antoine Primas, PA, DMPA 150 mg IM administered today and pt to schedule physical, if available, or problem visit to see provider when next depo is due.

## 2019-04-27 ENCOUNTER — Other Ambulatory Visit: Payer: Self-pay

## 2019-04-27 ENCOUNTER — Telehealth: Payer: Self-pay | Admitting: Family Medicine

## 2019-04-27 ENCOUNTER — Encounter: Payer: Self-pay | Admitting: Family Medicine

## 2019-04-27 ENCOUNTER — Ambulatory Visit (INDEPENDENT_AMBULATORY_CARE_PROVIDER_SITE_OTHER): Payer: 59 | Admitting: Family Medicine

## 2019-04-27 VITALS — BP 112/80 | HR 72 | Temp 97.1°F | Resp 16 | Ht 60.0 in | Wt 153.8 lb

## 2019-04-27 DIAGNOSIS — L089 Local infection of the skin and subcutaneous tissue, unspecified: Secondary | ICD-10-CM | POA: Diagnosis not present

## 2019-04-27 MED ORDER — CHLORHEXIDINE GLUCONATE 4 % EX LIQD: Freq: Two times a day (BID) | CUTANEOUS | 3 refills | Status: DC

## 2019-04-27 MED ORDER — CLINDAMYCIN PHOSPHATE 1 % EX GEL: Freq: Two times a day (BID) | CUTANEOUS | 3 refills | Status: DC

## 2019-04-27 MED ORDER — CHLORHEXIDINE GLUCONATE 4 % EX SOLN: 1.0000 "application " | Freq: Two times a day (BID) | CUTANEOUS | 6 refills | Status: DC

## 2019-04-27 NOTE — Progress Notes (Signed)
Name: Jaclyn Warren   MRN: [id number]    DOB: 11/26/1990   Date:04/27/2019       Progress Note  Subjective  Chief Complaint  Chief Complaint  Patient presents with  . Recurrent Skin Infections    HPI  PT presents with concern for recurrent abscesses for many years.  Notes her mother and daughter with similar issues.  At present she has a "boil" to the right groin that she would like evaluated.  She notes she will often have have boils to the axilla, groin, and just below her c-section scar - some will become about 2in in diameter; all will have purulent drainage.  Has never had I&D needed, no fevers/chills.  She does note scarring from this issue, but denies keloid scarring.  Patient Active Problem List   Diagnosis Date Noted  . Acute pain of left shoulder 08/08/2018  . Intermittent left-sided chest pain 08/08/2018  . Overweight 09/28/2014  . Bipolar disorder, unspecified (HCC) 09/28/2014  . History of herpes genitalis 09/28/2014    Social History   Tobacco Use  . Smoking status: Never Smoker  . Smokeless tobacco: Never Used  Substance Use Topics  . Alcohol use: No     Current Outpatient Medications:  .  fluconazole (DIFLUCAN) 150 MG tablet, Take 1 tablet once for yeast infection. If not improving in 3 days, call our office for follow up. (Patient not taking: Reported on 04/14/2019), Disp: 1 tablet, Rfl: 0 .  tiZANidine (ZANAFLEX) 4 MG tablet, Take 0.5-1 tablets (2-4 mg total) by mouth every 8 (eight) hours as needed for muscle spasms. (Patient not taking: Reported on 04/14/2019), Disp: 20 tablet, Rfl: 0  No Known Allergies  I personally reviewed active problem list, medication list, allergies, notes from last encounter, lab results with the patient/caregiver today.  ROS  Ten systems reviewed and is negative except as mentioned in HPI  Objective  Vitals:   04/27/19 1011  BP: 112/80  Pulse: 72  Resp: 16  Temp: (!) 97.1 F (36.2 C)  TempSrc: Temporal  SpO2:  96%  Weight: 153 lb 12.8 oz (69.8 kg)  Height: 5' (1.524 m)   Body mass index is 30.04 kg/m.  Nursing Note and Vital Signs reviewed.  Physical Exam  Constitutional: Patient appears well-developed and well-nourished. No distress.  HENT: Head: Normocephalic and atraumatic. Eyes: Conjunctivae and EOM are normal. No scleral icterus. Neck: Normal range of motion. Neck supple. No JVD present. Cardiovascular: Normal rate, regular rhythm and normal heart sounds.  No murmur heard. No BLE edema. Pulmonary/Chest: Effort normal and breath sounds normal. No respiratory distress. Musculoskeletal: Normal range of motion, no joint effusions. No gross deformities Neurological: Pt is alert and oriented to person, place, and time. No cranial nerve deficit. Coordination, balance, strength, speech and gait are normal.  Skin: There are 2 small (roughly nickel sized) raised, mildly erythematous lesions to the right groin and the right lower abdomen that have minimal fluctuance.  Psychiatric: Patient has a normal mood and affect. behavior is normal. Judgment and thought content normal.  No results found for this or any previous visit (from the past 72 hour(s)).  Assessment & Plan  1. Recurrent infection of skin - Dilute bleach bath weekly; clinda gel for acute outbreak, chlorhexadine daily for prevention.   - clindamycin (CLINDAGEL) 1 % gel; Apply topically 2 (two) times daily.  Dispense: 60 g; Refill: 3 - Chlorhexidine Gluconate 4 % SOLN; Apply 1 application topically 2 (two) times daily.  Dispense: 473  mL; Refill: 6

## 2019-04-27 NOTE — Telephone Encounter (Signed)
Rx is changed to external solution.

## 2019-04-27 NOTE — Patient Instructions (Addendum)
1/2 Cup Bleach once weekly with FULLY BATHTUB of water - soak for about 10 minutes. Apply Chlorhexadine twice daily every day. Apply Clindamycin twice daily for outbreak.

## 2019-04-27 NOTE — Telephone Encounter (Signed)
PLEASE SEND IN A NEW SCRIPT

## 2019-04-27 NOTE — Telephone Encounter (Signed)
Pharmacy needs clarification on the Chlorhexidine Gluconate 4 % SOLN  Pharmacy states this med comes as a mouth rinse. And instructions say apply external  she needs to clarify where the pt is using this? Or should this have been peridex which does comes 473 ml  [facility] (N), [place] [phone] (Phone) [phone] (Fax)

## 2019-04-28 ENCOUNTER — Telehealth: Payer: Self-pay | Admitting: Family Medicine

## 2019-04-28 NOTE — Telephone Encounter (Signed)
Copied from [place] [phone]. Topic: Quick Communication - Rx Refill/Question >> Apr 28, 2019  9:40 AM Rainey Pines A wrote: Medication: Chlorhexidine Gluconate 4 % SOLN (Pt stated that pharmacy has reached out but havent gotten a response to get medication sent over.)  Has the patient contacted their pharmacy? {Yes (Agent: If no, request that the patient contact the pharmacy for the refill.) (Agent: If yes, when and what did the pharmacy advise?)Contact PCP  Preferred Pharmacy (with phone number or street name): [facility] (N), [place] [phone] (Phone) [phone] (Fax)    Agent: Please be advised that RX refills may take up to 3 business days. We ask that you follow-up with your pharmacy.

## 2019-04-28 NOTE — Telephone Encounter (Signed)
Spoke to pharmacy.

## 2019-07-21 ENCOUNTER — Encounter: Payer: Self-pay | Admitting: Family Medicine

## 2019-07-21 ENCOUNTER — Other Ambulatory Visit: Payer: Self-pay

## 2019-07-21 ENCOUNTER — Ambulatory Visit (LOCAL_COMMUNITY_HEALTH_CENTER): Payer: Managed Care, Other (non HMO) | Admitting: Family Medicine

## 2019-07-21 VITALS — BP 112/76 | Ht 60.0 in | Wt 155.2 lb

## 2019-07-21 DIAGNOSIS — Z3042 Encounter for surveillance of injectable contraceptive: Secondary | ICD-10-CM

## 2019-07-21 DIAGNOSIS — Z30013 Encounter for initial prescription of injectable contraceptive: Secondary | ICD-10-CM

## 2019-07-21 DIAGNOSIS — Z3009 Encounter for other general counseling and advice on contraception: Secondary | ICD-10-CM

## 2019-07-21 MED ORDER — MEDROXYPROGESTERONE ACETATE 150 MG/ML IM SUSP
150.0000 mg | INTRAMUSCULAR | Status: AC
  Administered 2019-07-21 – 2020-05-20 (×4): 150 mg via INTRAMUSCULAR

## 2019-07-21 NOTE — Progress Notes (Signed)
Patient given Depo, right glute, tolerated well, next Depo card given.Burt Knack, RN

## 2019-07-21 NOTE — Progress Notes (Signed)
Patient here for Depo at 14 0/7 weeks since last Depo. Patient was told at last visit she needs PE with this Depo. Last PE was 11/2017, CBE and cotest Pap due 2022 per [facility] records.Burt Knack, RN

## 2019-07-21 NOTE — Progress Notes (Signed)
Family Planning Visit  Subjective:  Jaclyn Warren is a 29 y.o. being seen today for  Chief Complaint  Patient presents with  . Contraception    wants to continue Depo    Pt has Acute pain of left shoulder; Intermittent left-sided chest pain; Overweight; Bipolar disorder, unspecified ([place]); and History of herpes genitalis on their problem list.  HPI  Patient reports she is here for depo. She is 14wks since her last injection. Has been using since 2013.   Pt denies all of the following, which are contraindications to Depo use: Known breast cancer Pregnancy Also denies: Hypertension (CDC cat 2 if mild, cat 3 if severe) Severe cirrhosis, hepatocellular adenoma Diabetes with nephrosis or vascular complications Ischemic heart disease or multiple risk factors for atherosclerotic disease, and some forms of lupus Unexplained vaginal bleeding Pregnancy planned within the next year Long-term use of corticosteroid therapy in women with a history of, or risk factors for, nontraumatic (frailty) fractures.  Current use of aminoglutethimide (usually for the treatment of Cushing's syndrome) because aminoglutethimide may increase metabolism of progestins    No LMP recorded (lmp unknown). Patient has had an injection.  Pap: CBE and cotest Pap due 2022 per RN review of [facility] records.  Patient reports 1 partner(s) in last year. Do they desire STI screening (if no, why not)? No, declines  Does the patient desire a pregnancy in the next year? no   29 y.o., Body mass index is 30.31 kg/m. - Is patient eligible for HA1C diabetes screening based on BMI and age >33?  no  Does the patient have a current or past history of drug use? no No components found for: HCV  See flowsheet for other program required questions.   Health Maintenance Due  Topic Date Due  . INFLUENZA VACCINE  01/10/2019  . TETANUS/TDAP  06/21/2019    ROS  The following portions of the patient's history were  reviewed and updated as appropriate: allergies, current medications, past family history, past medical history, past social history, past surgical history and problem list. Problem list updated.  Objective:  BP 112/76   Ht 5' (1.524 m)   Wt 155 lb 3.2 oz (70.4 kg)   LMP  (LMP Unknown)   BMI 30.31 kg/m    Physical Exam  Gen: well appearing, NAD HEENT: no scleral icterus Lung: Normal WOB Ext: warm well perfused   Assessment and Plan:  Jaclyn Warren is a 29 y.o. female presenting to the [facility] Department for a well woman exam/family planning visit  Contraception counseling: Reviewed all forms of birth control options in the tiered based approach including abstinence; over the counter/barrier methods; hormonal contraceptive medication including pill, patch, ring, injection, contraceptive implant; hormonal and nonhormonal IUDs; permanent sterilization options including vasectomy and the various tubal sterilization modalities. Risks, benefits, how to discontinue and typical effectiveness rates were reviewed.  Questions were answered.  Written information was also given to the patient to review.  Patient desires depo, this was prescribed for patient. She will follow up in  3 months for surveillance.  She was told to call with any further questions, or with any concerns about this method of contraception.  Emphasized use of condoms 100% of the time for STI prevention.  Emergency Contraception: n/a - continuous use of depo    1. Encounter for surveillance of injectable contraceptive Rx x1 yr.  -Risks of Depo use >2 yrs discussed with pt. Alternative BCM discussed, pt would like to continue Depo. Suggested  calcium (1200 mg daily) and vitamin D (800 IU daily) supplements, weight bearing exercise and avoiding cigarette smoking and excessive alcohol consumption to promote bone health.  -Follow up in 3 months for repeat Depo Provera injection.  - medroxyPROGESTERone  (DEPO-PROVERA) injection 150 mg   Return in about 3 months (around 10/18/2019) for Depo.  No future appointments.  Ann Held, PA-C

## 2019-10-15 ENCOUNTER — Ambulatory Visit: Payer: 59

## 2019-10-16 ENCOUNTER — Other Ambulatory Visit: Payer: Self-pay

## 2019-10-16 ENCOUNTER — Ambulatory Visit: Payer: 59

## 2019-11-03 ENCOUNTER — Other Ambulatory Visit: Payer: Self-pay

## 2019-11-03 ENCOUNTER — Ambulatory Visit: Payer: 59

## 2019-11-04 ENCOUNTER — Encounter: Payer: Self-pay | Admitting: Physician Assistant

## 2019-11-04 ENCOUNTER — Ambulatory Visit (LOCAL_COMMUNITY_HEALTH_CENTER): Payer: 59 | Admitting: Physician Assistant

## 2019-11-04 VITALS — BP 112/81 | Ht 60.0 in | Wt 153.6 lb

## 2019-11-04 DIAGNOSIS — Z30013 Encounter for initial prescription of injectable contraceptive: Secondary | ICD-10-CM

## 2019-11-04 DIAGNOSIS — Z3042 Encounter for surveillance of injectable contraceptive: Secondary | ICD-10-CM

## 2019-11-04 DIAGNOSIS — Z3009 Encounter for other general counseling and advice on contraception: Secondary | ICD-10-CM

## 2019-11-04 NOTE — Progress Notes (Addendum)
Patient here for Depo today at 15 1/7 since last Depo. Declines STD testing. Last PE done 07/21/2019, and order for 1 year Depo written by Maximiano Coss, PA. Per C. Rolley Sims, patient ok to have Depo today. Patient given Depo per Maximiano Coss orders for DMPA 150mg , IM, q11-13 weeks x 1 year. Patient counseled that she will need PE in 07/2020. Pap also due 2022. Patient given next Depo due card.2023, RN

## 2019-11-06 NOTE — Progress Notes (Signed)
Reviewed note from 07/2019, visit and RN note.  RN given verbal to proceed with Depo today.

## 2020-02-10 ENCOUNTER — Other Ambulatory Visit: Payer: Self-pay

## 2020-02-10 ENCOUNTER — Ambulatory Visit: Payer: 59

## 2020-02-10 VITALS — BP 113/78 | Ht 60.0 in | Wt 154.0 lb

## 2020-02-10 DIAGNOSIS — Z30013 Encounter for initial prescription of injectable contraceptive: Secondary | ICD-10-CM | POA: Diagnosis not present

## 2020-02-10 DIAGNOSIS — Z3009 Encounter for other general counseling and advice on contraception: Secondary | ICD-10-CM

## 2020-02-10 DIAGNOSIS — Z3042 Encounter for surveillance of injectable contraceptive: Secondary | ICD-10-CM

## 2020-02-10 NOTE — Progress Notes (Signed)
Depo given per Staples, PA order in right glut. Richmond Campbell, RN

## 2020-05-20 ENCOUNTER — Ambulatory Visit (LOCAL_COMMUNITY_HEALTH_CENTER): Payer: Self-pay

## 2020-05-20 ENCOUNTER — Other Ambulatory Visit: Payer: Self-pay

## 2020-05-20 VITALS — BP 106/70 | HR 79 | Ht 60.0 in | Wt 142.0 lb

## 2020-05-20 DIAGNOSIS — Z30013 Encounter for initial prescription of injectable contraceptive: Secondary | ICD-10-CM

## 2020-05-20 DIAGNOSIS — Z3009 Encounter for other general counseling and advice on contraception: Secondary | ICD-10-CM

## 2020-05-20 NOTE — Progress Notes (Signed)
Depo administered without difficulty per 07/21/2019 written order of Samara Snide PA-C and client tolerated without complaint. Physical due with next Depo and client aware. Jossie Ng, RN

## 2020-08-18 ENCOUNTER — Other Ambulatory Visit: Payer: Self-pay

## 2020-08-18 ENCOUNTER — Ambulatory Visit (LOCAL_COMMUNITY_HEALTH_CENTER): Payer: Self-pay | Admitting: Family Medicine

## 2020-08-18 VITALS — BP 106/72 | Ht 60.0 in | Wt 133.0 lb

## 2020-08-18 DIAGNOSIS — Z30013 Encounter for initial prescription of injectable contraceptive: Secondary | ICD-10-CM

## 2020-08-18 DIAGNOSIS — Z01419 Encounter for gynecological examination (general) (routine) without abnormal findings: Secondary | ICD-10-CM

## 2020-08-18 DIAGNOSIS — Z3042 Encounter for surveillance of injectable contraceptive: Secondary | ICD-10-CM

## 2020-08-18 DIAGNOSIS — F319 Bipolar disorder, unspecified: Secondary | ICD-10-CM

## 2020-08-18 DIAGNOSIS — Z3009 Encounter for other general counseling and advice on contraception: Secondary | ICD-10-CM

## 2020-08-18 DIAGNOSIS — L732 Hidradenitis suppurativa: Secondary | ICD-10-CM

## 2020-08-18 MED ORDER — MEDROXYPROGESTERONE ACETATE 150 MG/ML IM SUSP
150.0000 mg | INTRAMUSCULAR | Status: AC
  Administered 2020-08-18 – 2020-11-10 (×2): 150 mg via INTRAMUSCULAR

## 2020-08-18 NOTE — Progress Notes (Signed)
Here for PE and Depo. Declines HIV/RPR.Richmond Campbell, RN

## 2020-08-18 NOTE — Progress Notes (Signed)
[facility] [address] Main Number: [phone]  Family Planning Visit  Subjective:  Jaclyn Warren is a 30 y.o.  No obstetric history on file.   being seen today for an initial well woman visit and to discuss family planning options.  She is currently using Depo Provera for pregnancy prevention. Patient reports she does not want a pregnancy in the next year.  Patient has the following medical conditions has Acute pain of left shoulder; Intermittent left-sided chest pain; Overweight; Bipolar 1 disorder (HCC); History of herpes genitalis; and Hidradenitis suppurativa on their problem list.  Chief Complaint  Patient presents with  . Annual Exam    Patient reports no changes in medical history. Denies known breast cancer or pregnancy. Last depo 05/20/20  Patient denies changes in med history   Body mass index is 25.97 kg/m. - Patient is eligible for diabetes screening based on BMI and age >78?  no HA1C ordered? no  Patient reports 1  partner/s in last year. Desires STI screening?  No - declines  Has patient been screened once for HCV in the past?  No  No results found for: HCVAB  Does the patient have current drug use (including MJ), have a partner with drug use, and/or has been incarcerated since last result? No  If yes-- Screen for HCV through [facility]   Does the patient meet criteria for HBV testing? No  Criteria:  -Household, sexual or needle sharing contact with HBV -History of drug use -HIV positive -Those with known Hep C   Health Maintenance Due  Topic Date Due  . Hepatitis C Screening  Never done  . COVID-19 Vaccine (1) Never done  . TETANUS/TDAP  06/21/2019  . INFLUENZA VACCINE  Never done    Review of Systems  Constitutional: Negative for chills and fever.  Eyes: Negative for blurred vision and double vision.  Respiratory: Negative for cough and shortness of breath.   Cardiovascular:  Negative for chest pain and orthopnea.  Gastrointestinal: Negative for nausea and vomiting.  Genitourinary: Negative for dysuria, flank pain and frequency.  Musculoskeletal: Negative for myalgias.  Skin: Negative for rash.  Neurological: Negative for dizziness, tingling, weakness and headaches.  Endo/Heme/Allergies: Does not bruise/bleed easily.  Psychiatric/Behavioral: Negative for depression and suicidal ideas. The patient is not nervous/anxious.     The following portions of the patient's history were reviewed and updated as appropriate: allergies, current medications, past family history, past medical history, past social history, past surgical history and problem list. Problem list updated.   See flowsheet for other program required questions.  Objective:   Vitals:   08/18/20 1034  BP: 106/72  Weight: 133 lb (60.3 kg)  Height: 5' (1.524 m)    Physical Exam Constitutional:      Appearance: Normal appearance.  HENT:     Head: Normocephalic and atraumatic.  Pulmonary:     Effort: Pulmonary effort is normal.  Abdominal:     Palpations: Abdomen is soft.  Musculoskeletal:        General: Normal range of motion.  Skin:    General: Skin is warm and dry.  Neurological:     General: No focal deficit present.     Mental Status: She is alert.  Psychiatric:        Mood and Affect: Mood normal.        Behavior: Behavior normal.     Assessment and Plan:  Jaclyn Warren is a  30 y.o. female presenting to the [facility] Department for an initial well woman exam/family planning visit  Contraception counseling: Reviewed all forms of birth control options in the tiered based approach. available including abstinence; over the counter/barrier methods; hormonal contraceptive medication including pill, patch, ring, injection,contraceptive implant, ECP; hormonal and nonhormonal IUDs; permanent sterilization options including vasectomy and the various tubal sterilization  modalities. Risks, benefits, and typical effectiveness rates were reviewed.  Questions were answered.  Written information was also given to the patient to review.  Patient desires Depo, this was prescribed for patient. She will follow up in  1 yr for surveillance.  She was told to call with any further questions, or with any concerns about this method of contraception.  Emphasized use of condoms 100% of the time for STI prevention.  ECP not indicated  1. Family planning - IGP, Aptima HPV - medroxyPROGESTERone (DEPO-PROVERA) injection 150 mg  2. Evaluation for contraceptive injection - medroxyPROGESTERone (DEPO-PROVERA) injection 150 mg  3. Well woman exam with routine gynecological exam Declines CBE Declines STI screening - IGP, Aptima HPV  4. Encounter for surveillance of injectable contraceptive - medroxyPROGESTERone (DEPO-PROVERA) injection 150 mg  5. Hidradenitis suppurativa Recommended hibiclens Needs PCP follow up  6. Bipolar 1 disorder (HCC) Discussed sx and dx Given LCSW card and R[facility]    Return in about 1 year (around 08/18/2021) for Yearly wellness exam.  No future appointments.  Federico Flake, MD

## 2020-08-25 LAB — IGP, APTIMA HPV
HPV Aptima: NEGATIVE
PAP Smear Comment: 0

## 2020-11-10 ENCOUNTER — Ambulatory Visit (LOCAL_COMMUNITY_HEALTH_CENTER): Payer: Self-pay

## 2020-11-10 ENCOUNTER — Other Ambulatory Visit: Payer: Self-pay

## 2020-11-10 VITALS — BP 110/77 | HR 79 | Ht 60.0 in | Wt 135.5 lb

## 2020-11-10 DIAGNOSIS — Z30013 Encounter for initial prescription of injectable contraceptive: Secondary | ICD-10-CM

## 2020-11-10 DIAGNOSIS — Z3009 Encounter for other general counseling and advice on contraception: Secondary | ICD-10-CM

## 2020-11-10 NOTE — Progress Notes (Signed)
Presents for Depo injections as 12 weeks since previous dose on 08/18/2020. Depo administered as per Dr. Alvester Morin written order and client tolerated without complaint. Jossie Ng, RN
